# Patient Record
Sex: Female | Born: 1995 | Race: White | Hispanic: No | Marital: Single | Smoking: Never smoker
Health system: Southern US, Community
[De-identification: ages and names within clinical notes are randomized; demographics above are authoritative.]

## PROBLEM LIST (undated history)

## (undated) ENCOUNTER — Inpatient Hospital Stay: Payer: Self-pay

## (undated) DIAGNOSIS — N390 Urinary tract infection, site not specified: Secondary | ICD-10-CM

## (undated) DIAGNOSIS — R12 Heartburn: Secondary | ICD-10-CM

## (undated) DIAGNOSIS — D2111 Benign neoplasm of connective and other soft tissue of right upper limb, including shoulder: Secondary | ICD-10-CM

## (undated) DIAGNOSIS — F988 Other specified behavioral and emotional disorders with onset usually occurring in childhood and adolescence: Secondary | ICD-10-CM

## (undated) HISTORY — DX: Benign neoplasm of connective and other soft tissue of right upper limb, including shoulder: D21.11

## (undated) HISTORY — PX: WISDOM TOOTH EXTRACTION: SHX21

## (undated) HISTORY — DX: Other specified behavioral and emotional disorders with onset usually occurring in childhood and adolescence: F98.8

---
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
## 2017-03-06 IMAGING — CT CT ABD-PELV W/ CM
2 of 4 series · 15 of 46 positions shown, 17 images · IV contrast (APPLIED)
Comparison: None.

CLINICAL DATA: The patient is 9 weeks postpartum. Vomiting since
yesterday. The patient reports a lump in her abdomen. Dizziness.

EXAM:
CT ABDOMEN AND PELVIS WITH CONTRAST
TECHNIQUE: Multidetector CT imaging of the abdomen and pelvis was performed
using the standard protocol following bolus administration of
intravenous contrast.
CONTRAST:  75 ml [id number] IOPAMIDOL ([id number]) INJECTION 61%

[Series 2: routine abd/pel with · axial · 0.66mm/px · z∈[-184,+226]mm · 12 of 90 slices shown, 14 images]
[im 4/90  soft-tissue]
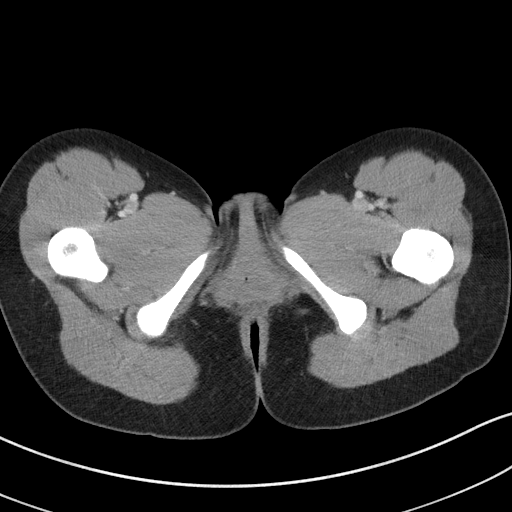
[im 4/90  bone]
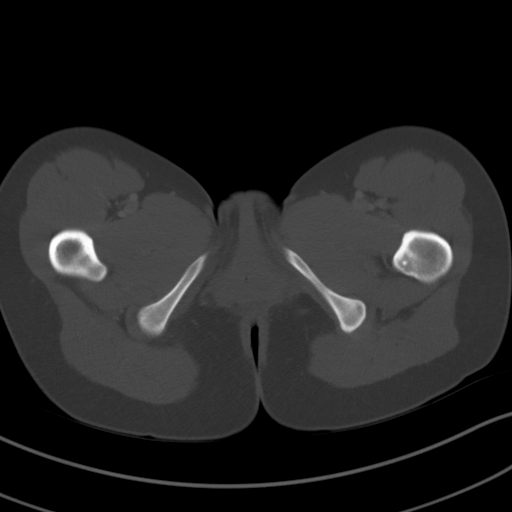
[im 12/90  soft-tissue]
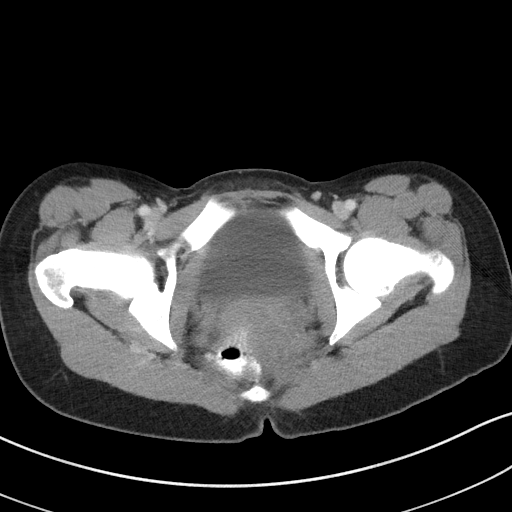
[im 20/90  soft-tissue]
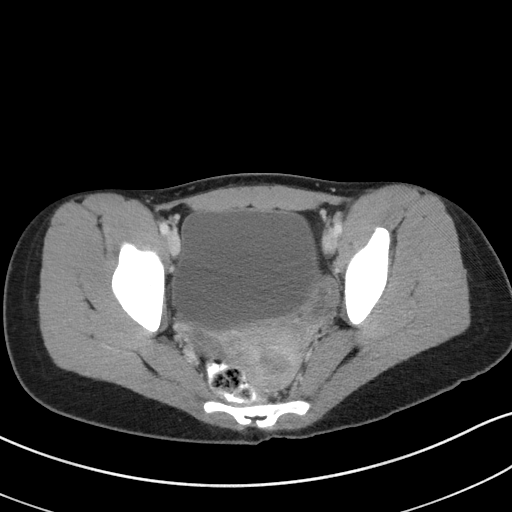
[im 28/90  soft-tissue]
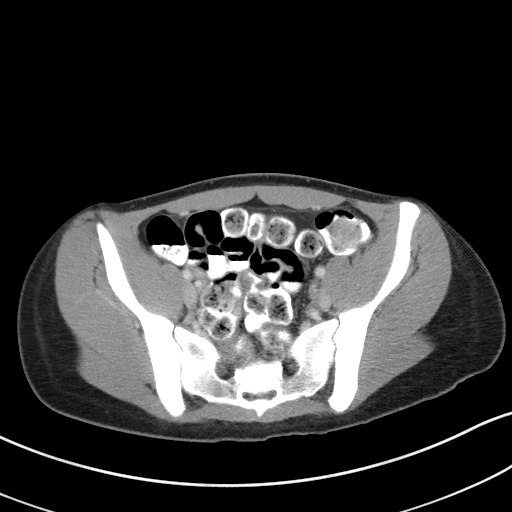
[im 35/90  soft-tissue]
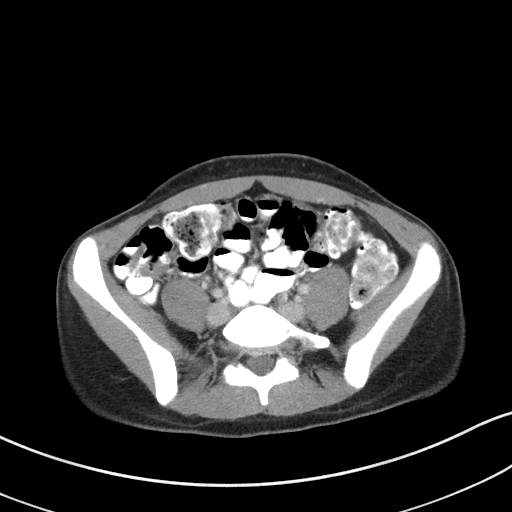
[im 43/90  soft-tissue]
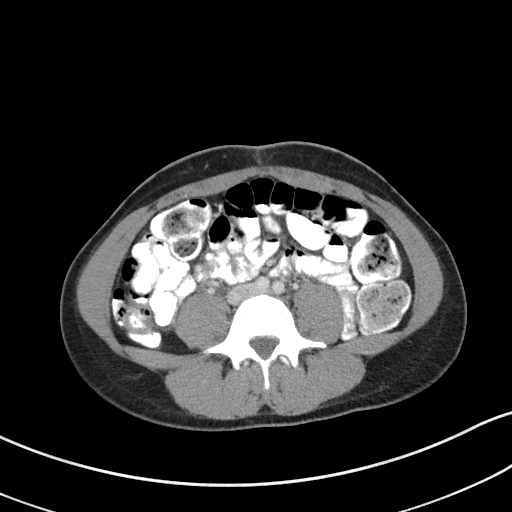
[im 47/90  soft-tissue]
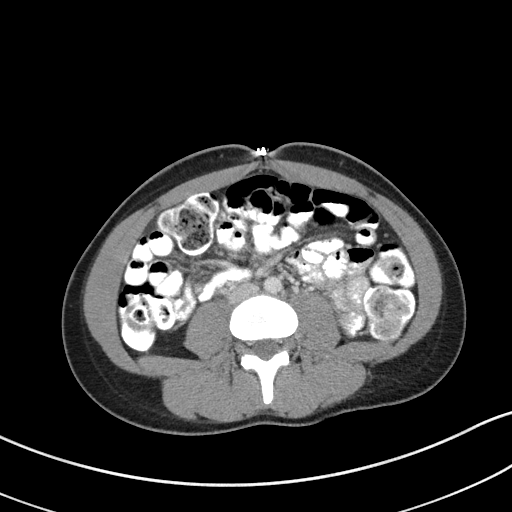
[im 55/90  soft-tissue]
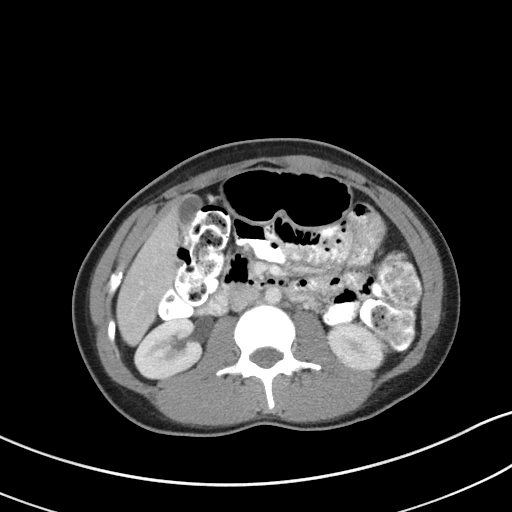
[im 62/90  soft-tissue]
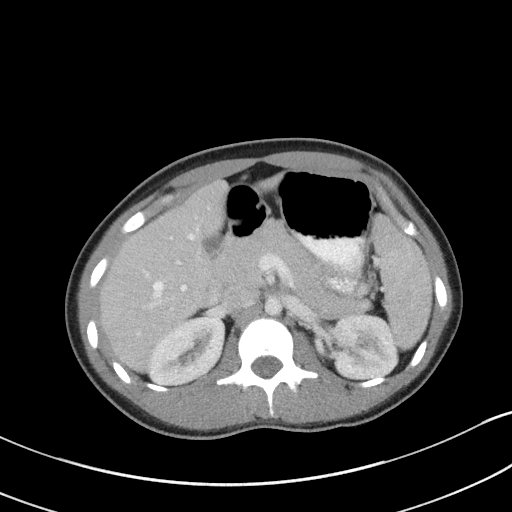
[im 62/90  bone]
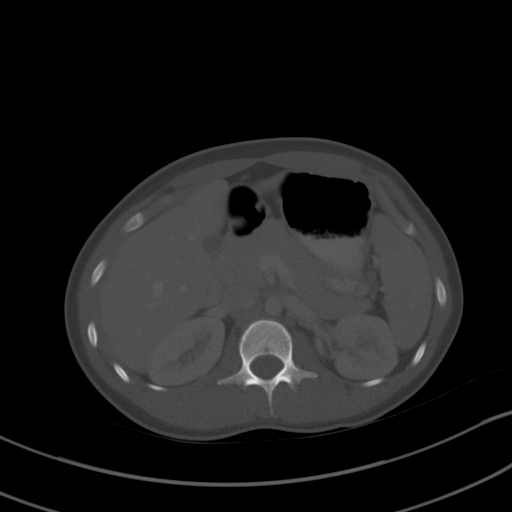
[im 70/90  soft-tissue]
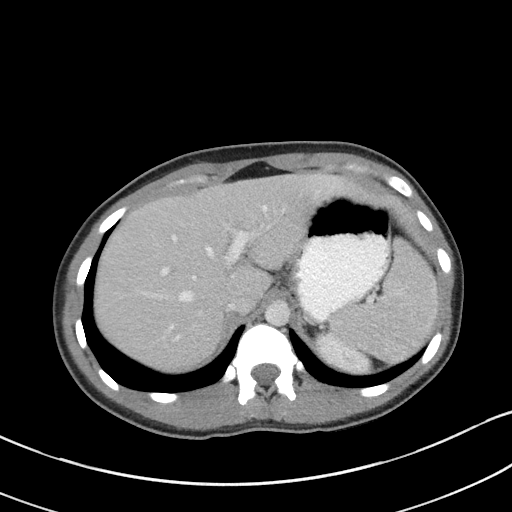
[im 78/90  soft-tissue]
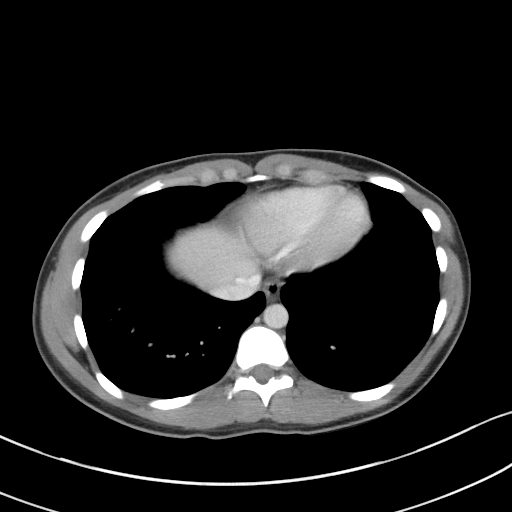
[im 86/90  soft-tissue]
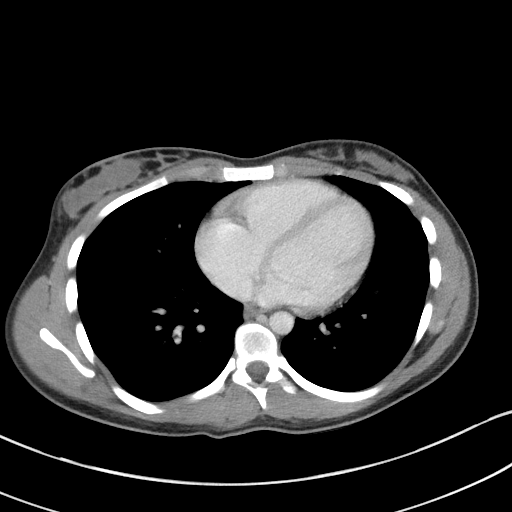

[Series 5: coronal st · coronal · 0.58mm/px · 3 of 62 slices shown]
[im 21/62  soft-tissue]
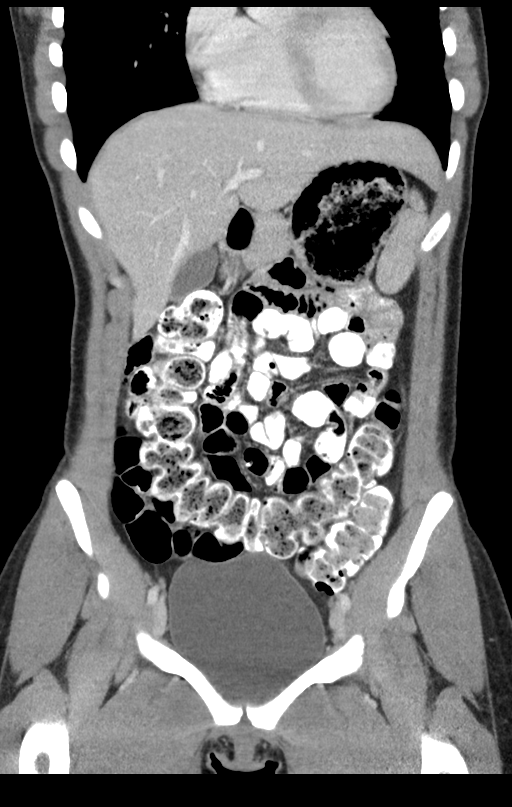
[im 28/62  soft-tissue]
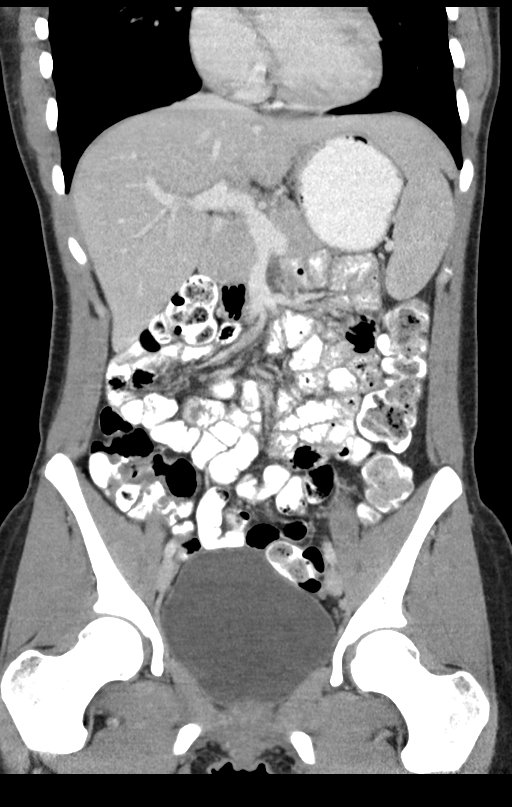
[im 34/62  soft-tissue]
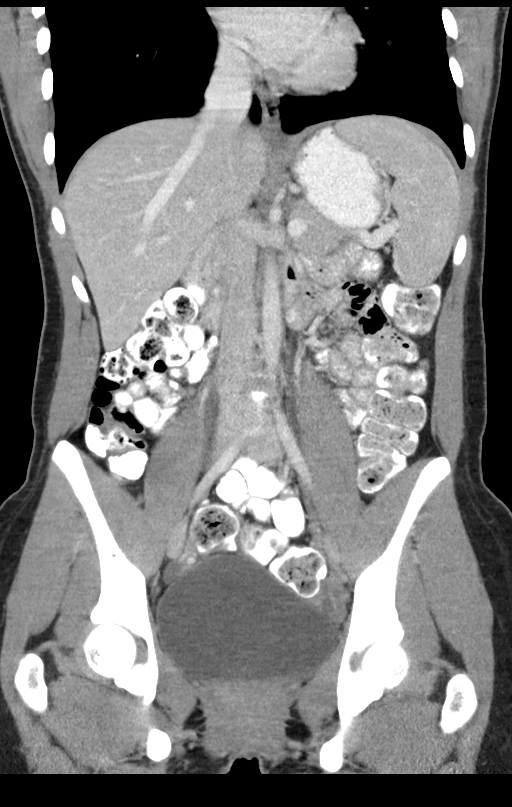

[15 of 46 positions shown; findings below may reference images not displayed]

FINDINGS: Lower chest: Lung bases are clear. No pleural or pericardial
effusion.

Hepatobiliary: No focal liver abnormality is seen. No gallstones,
gallbladder wall thickening, or biliary dilatation.

Pancreas: Unremarkable. No pancreatic ductal dilatation or
surrounding inflammatory changes.

Spleen: Normal in size without focal abnormality.

Adrenals/Urinary Tract: The adrenal glands appear normal. 0.3 cm
nonobstructing stone lower pole right kidney is noted. The kidneys
otherwise normal appearance. Ureters and urinary bladder appear
normal.

Stomach/Bowel: Stomach is within normal limits. Appendix appears
normal. No evidence of bowel wall thickening, distention, or
inflammatory changes.

Vascular/Lymphatic: No significant vascular findings are present. No
enlarged abdominal or pelvic lymph nodes.

Reproductive: Uterus and bilateral adnexa are unremarkable.

Other: No abdominal wall hernia or abnormality. No abdominopelvic
ascites.

Musculoskeletal: No acute abnormality. Bilateral L5 pars
interarticularis defects with associated trace anterolisthesis L5 on
S1 noted.
IMPRESSION: No acute abnormality or finding to explain the patient's symptoms.

0.3 cm nonobstructing stone lower pole right kidney.

Bilateral L5 pars interarticularis defects with associated trace
anterolisthesis L5 on S1.

## 2017-05-22 ENCOUNTER — Telehealth: Payer: Self-pay | Admitting: Family Medicine

## 2017-05-22 NOTE — Telephone Encounter (Signed)
Patient's mother,Dana,called.  Patient has moved out of town and Hinton Dyer is requesting immunization records faxed to her business at fax number [phone].

## 2017-05-25 NOTE — Telephone Encounter (Signed)
Immunization record faxed as requested.
